# Patient Record
Sex: Female | Born: 1985
Health system: Southern US, Community
[De-identification: ages and names within clinical notes are randomized; demographics above are authoritative.]

## PROBLEM LIST (undated history)

## (undated) DIAGNOSIS — M24673 Ankylosis, unspecified ankle: Secondary | ICD-10-CM

## (undated) HISTORY — DX: Ankylosis, unspecified ankle: M24.673

---

## 2015-11-14 DIAGNOSIS — Z9222 Personal history of monoclonal drug therapy: Secondary | ICD-10-CM | POA: Diagnosis not present

## 2015-11-14 DIAGNOSIS — M4690 Unspecified inflammatory spondylopathy, site unspecified: Secondary | ICD-10-CM | POA: Diagnosis not present

## 2015-11-21 DIAGNOSIS — K080 Exfoliation of teeth due to systemic causes: Secondary | ICD-10-CM | POA: Diagnosis not present

## 2015-12-24 DIAGNOSIS — Z6829 Body mass index (BMI) 29.0-29.9, adult: Secondary | ICD-10-CM | POA: Diagnosis not present

## 2015-12-24 DIAGNOSIS — Z01419 Encounter for gynecological examination (general) (routine) without abnormal findings: Secondary | ICD-10-CM | POA: Diagnosis not present

## 2016-04-28 DIAGNOSIS — L723 Sebaceous cyst: Secondary | ICD-10-CM | POA: Diagnosis not present

## 2016-04-28 DIAGNOSIS — Z3009 Encounter for other general counseling and advice on contraception: Secondary | ICD-10-CM | POA: Diagnosis not present

## 2016-05-07 DIAGNOSIS — Z9222 Personal history of monoclonal drug therapy: Secondary | ICD-10-CM | POA: Diagnosis not present

## 2016-05-07 DIAGNOSIS — M4690 Unspecified inflammatory spondylopathy, site unspecified: Secondary | ICD-10-CM | POA: Diagnosis not present

## 2016-05-18 DIAGNOSIS — Z30430 Encounter for insertion of intrauterine contraceptive device: Secondary | ICD-10-CM | POA: Diagnosis not present

## 2016-06-22 DIAGNOSIS — N644 Mastodynia: Secondary | ICD-10-CM | POA: Diagnosis not present

## 2016-07-07 DIAGNOSIS — Z30431 Encounter for routine checking of intrauterine contraceptive device: Secondary | ICD-10-CM | POA: Diagnosis not present

## 2016-07-07 DIAGNOSIS — N644 Mastodynia: Secondary | ICD-10-CM | POA: Diagnosis not present

## 2016-09-01 DIAGNOSIS — Z9222 Personal history of monoclonal drug therapy: Secondary | ICD-10-CM | POA: Diagnosis not present

## 2016-09-01 DIAGNOSIS — M4690 Unspecified inflammatory spondylopathy, site unspecified: Secondary | ICD-10-CM | POA: Diagnosis not present

## 2016-09-01 DIAGNOSIS — M19031 Primary osteoarthritis, right wrist: Secondary | ICD-10-CM | POA: Diagnosis not present

## 2016-09-01 DIAGNOSIS — M19032 Primary osteoarthritis, left wrist: Secondary | ICD-10-CM | POA: Diagnosis not present

## 2016-09-01 DIAGNOSIS — Z6831 Body mass index (BMI) 31.0-31.9, adult: Secondary | ICD-10-CM | POA: Diagnosis not present

## 2017-01-19 DIAGNOSIS — K080 Exfoliation of teeth due to systemic causes: Secondary | ICD-10-CM | POA: Diagnosis not present

## 2017-02-19 DIAGNOSIS — R5383 Other fatigue: Secondary | ICD-10-CM | POA: Diagnosis not present

## 2017-02-19 DIAGNOSIS — Z0000 Encounter for general adult medical examination without abnormal findings: Secondary | ICD-10-CM | POA: Diagnosis not present

## 2017-02-19 DIAGNOSIS — R51 Headache: Secondary | ICD-10-CM | POA: Diagnosis not present

## 2017-02-19 DIAGNOSIS — E669 Obesity, unspecified: Secondary | ICD-10-CM | POA: Diagnosis not present

## 2017-02-22 ENCOUNTER — Other Ambulatory Visit: Payer: Self-pay | Admitting: Family Medicine

## 2017-02-23 ENCOUNTER — Other Ambulatory Visit: Payer: Self-pay | Admitting: Family Medicine

## 2017-02-23 DIAGNOSIS — G4452 New daily persistent headache (NDPH): Secondary | ICD-10-CM

## 2017-02-28 ENCOUNTER — Ambulatory Visit
Admission: RE | Admit: 2017-02-28 | Discharge: 2017-02-28 | Disposition: A | Discharge status: Home / Self Care | Payer: Self-pay | Source: Ambulatory Visit | Attending: Family Medicine | Admitting: Family Medicine

## 2017-02-28 DIAGNOSIS — R51 Headache: Secondary | ICD-10-CM | POA: Diagnosis not present

## 2017-02-28 DIAGNOSIS — G4452 New daily persistent headache (NDPH): Secondary | ICD-10-CM

## 2017-03-04 DIAGNOSIS — Z9222 Personal history of monoclonal drug therapy: Secondary | ICD-10-CM | POA: Diagnosis not present

## 2017-03-04 DIAGNOSIS — M4690 Unspecified inflammatory spondylopathy, site unspecified: Secondary | ICD-10-CM | POA: Diagnosis not present

## 2017-03-16 DIAGNOSIS — Z6832 Body mass index (BMI) 32.0-32.9, adult: Secondary | ICD-10-CM | POA: Diagnosis not present

## 2017-03-16 DIAGNOSIS — Z01419 Encounter for gynecological examination (general) (routine) without abnormal findings: Secondary | ICD-10-CM | POA: Diagnosis not present

## 2017-04-07 DIAGNOSIS — M5408 Panniculitis affecting regions of neck and back, sacral and sacrococcygeal region: Secondary | ICD-10-CM | POA: Diagnosis not present

## 2017-04-07 DIAGNOSIS — M9902 Segmental and somatic dysfunction of thoracic region: Secondary | ICD-10-CM | POA: Diagnosis not present

## 2017-04-07 DIAGNOSIS — M62838 Other muscle spasm: Secondary | ICD-10-CM | POA: Diagnosis not present

## 2017-04-07 DIAGNOSIS — M9903 Segmental and somatic dysfunction of lumbar region: Secondary | ICD-10-CM | POA: Diagnosis not present

## 2017-04-13 DIAGNOSIS — M9903 Segmental and somatic dysfunction of lumbar region: Secondary | ICD-10-CM | POA: Diagnosis not present

## 2017-04-13 DIAGNOSIS — M9902 Segmental and somatic dysfunction of thoracic region: Secondary | ICD-10-CM | POA: Diagnosis not present

## 2017-04-13 DIAGNOSIS — M62838 Other muscle spasm: Secondary | ICD-10-CM | POA: Diagnosis not present

## 2017-04-13 DIAGNOSIS — M5408 Panniculitis affecting regions of neck and back, sacral and sacrococcygeal region: Secondary | ICD-10-CM | POA: Diagnosis not present

## 2017-05-14 DIAGNOSIS — D508 Other iron deficiency anemias: Secondary | ICD-10-CM | POA: Diagnosis not present

## 2017-05-14 DIAGNOSIS — E559 Vitamin D deficiency, unspecified: Secondary | ICD-10-CM | POA: Diagnosis not present

## 2017-05-14 DIAGNOSIS — R946 Abnormal results of thyroid function studies: Secondary | ICD-10-CM | POA: Diagnosis not present

## 2017-08-04 DIAGNOSIS — K080 Exfoliation of teeth due to systemic causes: Secondary | ICD-10-CM | POA: Diagnosis not present

## 2017-11-03 DIAGNOSIS — G44209 Tension-type headache, unspecified, not intractable: Secondary | ICD-10-CM | POA: Diagnosis not present

## 2017-11-03 DIAGNOSIS — E559 Vitamin D deficiency, unspecified: Secondary | ICD-10-CM | POA: Diagnosis not present

## 2017-11-03 DIAGNOSIS — D508 Other iron deficiency anemias: Secondary | ICD-10-CM | POA: Diagnosis not present

## 2017-11-03 DIAGNOSIS — R946 Abnormal results of thyroid function studies: Secondary | ICD-10-CM | POA: Diagnosis not present

## 2017-11-10 DIAGNOSIS — F411 Generalized anxiety disorder: Secondary | ICD-10-CM | POA: Diagnosis not present

## 2017-12-15 DIAGNOSIS — F411 Generalized anxiety disorder: Secondary | ICD-10-CM | POA: Diagnosis not present

## 2017-12-22 DIAGNOSIS — F411 Generalized anxiety disorder: Secondary | ICD-10-CM | POA: Diagnosis not present

## 2017-12-29 DIAGNOSIS — F411 Generalized anxiety disorder: Secondary | ICD-10-CM | POA: Diagnosis not present

## 2018-01-12 DIAGNOSIS — F411 Generalized anxiety disorder: Secondary | ICD-10-CM | POA: Diagnosis not present

## 2018-01-19 DIAGNOSIS — F411 Generalized anxiety disorder: Secondary | ICD-10-CM | POA: Diagnosis not present

## 2018-01-19 DIAGNOSIS — Z79899 Other long term (current) drug therapy: Secondary | ICD-10-CM | POA: Diagnosis not present

## 2018-01-19 DIAGNOSIS — L409 Psoriasis, unspecified: Secondary | ICD-10-CM | POA: Diagnosis not present

## 2018-01-19 DIAGNOSIS — M459 Ankylosing spondylitis of unspecified sites in spine: Secondary | ICD-10-CM | POA: Diagnosis not present

## 2018-01-26 DIAGNOSIS — F411 Generalized anxiety disorder: Secondary | ICD-10-CM | POA: Diagnosis not present

## 2018-02-14 DIAGNOSIS — F411 Generalized anxiety disorder: Secondary | ICD-10-CM | POA: Diagnosis not present

## 2018-02-21 DIAGNOSIS — F411 Generalized anxiety disorder: Secondary | ICD-10-CM | POA: Diagnosis not present

## 2018-03-14 DIAGNOSIS — F411 Generalized anxiety disorder: Secondary | ICD-10-CM | POA: Diagnosis not present

## 2018-03-21 DIAGNOSIS — F411 Generalized anxiety disorder: Secondary | ICD-10-CM | POA: Diagnosis not present

## 2018-04-04 DIAGNOSIS — F411 Generalized anxiety disorder: Secondary | ICD-10-CM | POA: Diagnosis not present

## 2018-04-11 DIAGNOSIS — F411 Generalized anxiety disorder: Secondary | ICD-10-CM | POA: Diagnosis not present

## 2018-04-18 DIAGNOSIS — F411 Generalized anxiety disorder: Secondary | ICD-10-CM | POA: Diagnosis not present

## 2018-05-10 DIAGNOSIS — F411 Generalized anxiety disorder: Secondary | ICD-10-CM | POA: Diagnosis not present

## 2018-05-23 DIAGNOSIS — Z1151 Encounter for screening for human papillomavirus (HPV): Secondary | ICD-10-CM | POA: Diagnosis not present

## 2018-05-23 DIAGNOSIS — Z6833 Body mass index (BMI) 33.0-33.9, adult: Secondary | ICD-10-CM | POA: Diagnosis not present

## 2018-05-23 DIAGNOSIS — Z01419 Encounter for gynecological examination (general) (routine) without abnormal findings: Secondary | ICD-10-CM | POA: Diagnosis not present

## 2018-06-09 DIAGNOSIS — F411 Generalized anxiety disorder: Secondary | ICD-10-CM | POA: Diagnosis not present

## 2018-06-17 DIAGNOSIS — H9193 Unspecified hearing loss, bilateral: Secondary | ICD-10-CM | POA: Diagnosis not present

## 2018-07-14 DIAGNOSIS — Z20828 Contact with and (suspected) exposure to other viral communicable diseases: Secondary | ICD-10-CM | POA: Diagnosis not present

## 2018-07-21 DIAGNOSIS — Z79899 Other long term (current) drug therapy: Secondary | ICD-10-CM | POA: Diagnosis not present

## 2018-07-21 DIAGNOSIS — L409 Psoriasis, unspecified: Secondary | ICD-10-CM | POA: Diagnosis not present

## 2018-07-21 DIAGNOSIS — M459 Ankylosing spondylitis of unspecified sites in spine: Secondary | ICD-10-CM | POA: Diagnosis not present

## 2018-07-22 DIAGNOSIS — M459 Ankylosing spondylitis of unspecified sites in spine: Secondary | ICD-10-CM | POA: Diagnosis not present

## 2018-08-25 DIAGNOSIS — F411 Generalized anxiety disorder: Secondary | ICD-10-CM | POA: Diagnosis not present

## 2018-09-13 DIAGNOSIS — F411 Generalized anxiety disorder: Secondary | ICD-10-CM | POA: Diagnosis not present

## 2018-09-22 DIAGNOSIS — F411 Generalized anxiety disorder: Secondary | ICD-10-CM | POA: Diagnosis not present

## 2018-10-06 DIAGNOSIS — F411 Generalized anxiety disorder: Secondary | ICD-10-CM | POA: Diagnosis not present

## 2018-10-12 DIAGNOSIS — F411 Generalized anxiety disorder: Secondary | ICD-10-CM | POA: Diagnosis not present

## 2018-10-19 DIAGNOSIS — F411 Generalized anxiety disorder: Secondary | ICD-10-CM | POA: Diagnosis not present

## 2018-11-14 DIAGNOSIS — J4531 Mild persistent asthma with (acute) exacerbation: Secondary | ICD-10-CM | POA: Diagnosis not present

## 2018-11-14 DIAGNOSIS — J209 Acute bronchitis, unspecified: Secondary | ICD-10-CM | POA: Diagnosis not present

## 2018-12-27 DIAGNOSIS — F411 Generalized anxiety disorder: Secondary | ICD-10-CM | POA: Diagnosis not present

## 2019-01-02 DIAGNOSIS — F411 Generalized anxiety disorder: Secondary | ICD-10-CM | POA: Diagnosis not present

## 2019-01-20 DIAGNOSIS — Z79899 Other long term (current) drug therapy: Secondary | ICD-10-CM | POA: Diagnosis not present

## 2019-01-20 DIAGNOSIS — M2550 Pain in unspecified joint: Secondary | ICD-10-CM | POA: Diagnosis not present

## 2019-01-20 DIAGNOSIS — M459 Ankylosing spondylitis of unspecified sites in spine: Secondary | ICD-10-CM | POA: Diagnosis not present

## 2019-01-20 DIAGNOSIS — L409 Psoriasis, unspecified: Secondary | ICD-10-CM | POA: Diagnosis not present

## 2019-01-27 DIAGNOSIS — F411 Generalized anxiety disorder: Secondary | ICD-10-CM | POA: Diagnosis not present

## 2019-04-25 DIAGNOSIS — F411 Generalized anxiety disorder: Secondary | ICD-10-CM | POA: Diagnosis not present

## 2019-05-11 DIAGNOSIS — F411 Generalized anxiety disorder: Secondary | ICD-10-CM | POA: Diagnosis not present

## 2019-05-27 DIAGNOSIS — N390 Urinary tract infection, site not specified: Secondary | ICD-10-CM | POA: Diagnosis not present

## 2019-06-13 DIAGNOSIS — F411 Generalized anxiety disorder: Secondary | ICD-10-CM | POA: Diagnosis not present

## 2019-06-20 DIAGNOSIS — F411 Generalized anxiety disorder: Secondary | ICD-10-CM | POA: Diagnosis not present

## 2019-06-27 DIAGNOSIS — F411 Generalized anxiety disorder: Secondary | ICD-10-CM | POA: Diagnosis not present

## 2019-07-02 IMAGING — MR MR HEAD W/O CM
10 series · 48 of 48 positions shown · non-contrast
Comparison: None.

CLINICAL DATA: 31-year-old female with 2-3 months of daily,
persistent headaches. Pain at the base of the skull and neck.

EXAM:
MRI HEAD WITHOUT CONTRAST
TECHNIQUE: Multiplanar, multiecho pulse sequences of the brain and surrounding
structures were obtained without intravenous contrast.

[Series 2: t1_se_sag · sagittal · 5.0mm · 0.45mm/px · 3 of 24 slices shown]
[im 1/24]
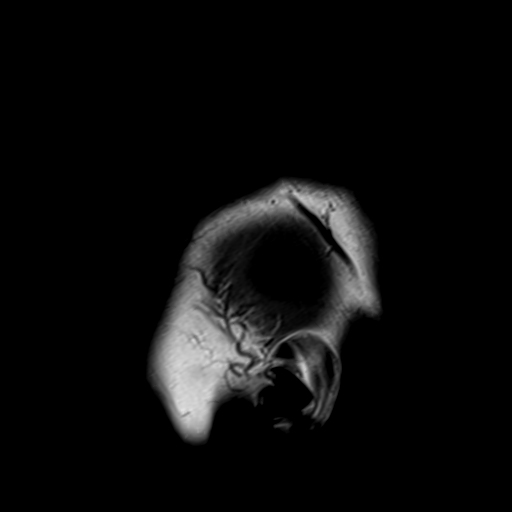
[im 12/24]
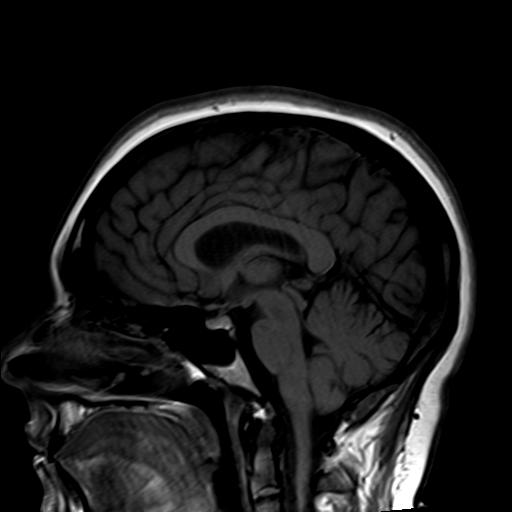
[im 24/24]
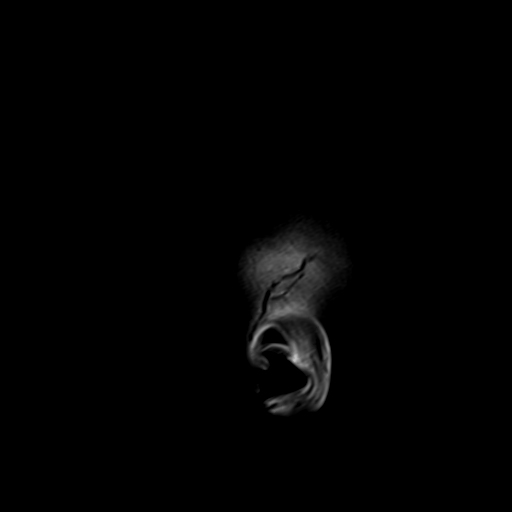

[Series 3: ep2d_diff_(id)_trace · axial · 3.0mm · 1.88mm/px · z∈[-81,+72]mm · 8 of 104 slices shown]
[im 1/104]
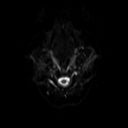
[im 15/104]
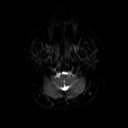
[im 30/104]
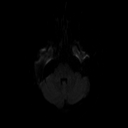
[im 45/104]
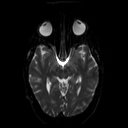
[im 59/104]
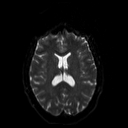
[im 74/104]
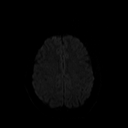
[im 89/104]
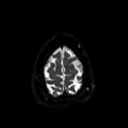
[im 104/104]
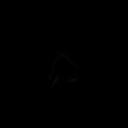

[Series 4: ep2d_diff_(id)_trace_adc · axial · 3.0mm · 1.88mm/px · z∈[-81,+72]mm · 4 of 52 slices shown]
[im 1/52]
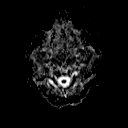
[im 18/52]
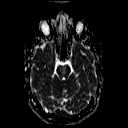
[im 35/52]
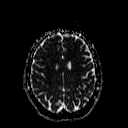
[im 52/52]
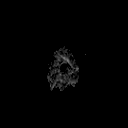

[Series 5: ep2d_diff_cor · coronal · 5.0mm · 1.77mm/px · 5 of 62 slices shown]
[im 1/62]
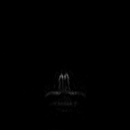
[im 16/62]
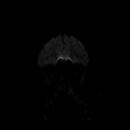
[im 31/62]
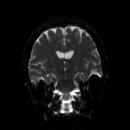
[im 46/62]
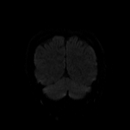
[im 62/62]
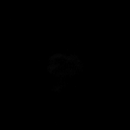

[Series 6: ep2d_diff_cor_adc · coronal · 5.0mm · 1.77mm/px · 2 of 31 slices shown]
[im 1/31]
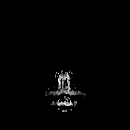
[im 31/31]
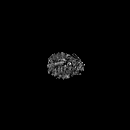

[Series 8: swi_images · axial · 2.0mm · 0.94mm/px · z∈[-88,+70]mm · 6 of 80 slices shown]
[im 1/80]
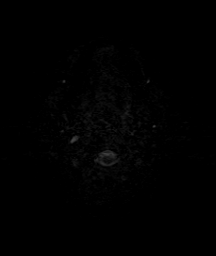
[im 16/80]
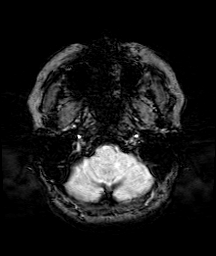
[im 32/80]
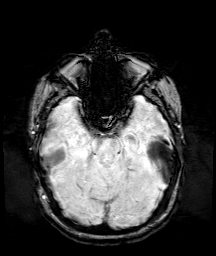
[im 48/80]
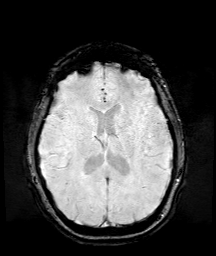
[im 64/80]
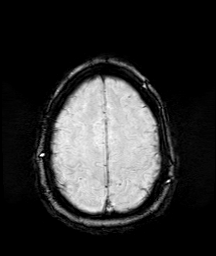
[im 80/80]
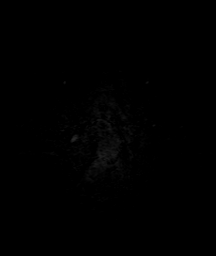

[Series 9: FLAIR · axial · 3.0mm · 0.47mm/px · z∈[-87,+68]mm · 2 of 27 slices shown]
[im 1/27]
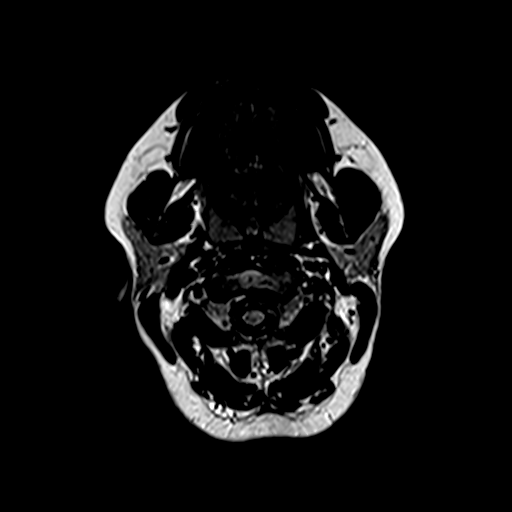
[im 27/27]
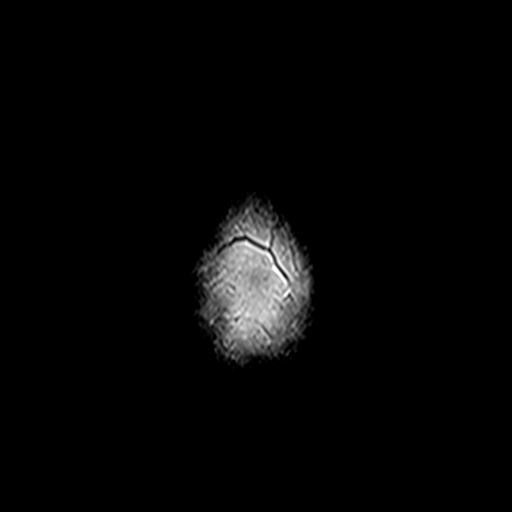

[Series 10: t2_tse_tra_512 · axial · 5.0mm · 0.62mm/px · z∈[-86,+69]mm · 2 of 27 slices shown]
[im 1/27]
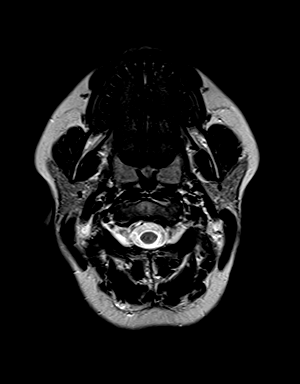
[im 27/27]
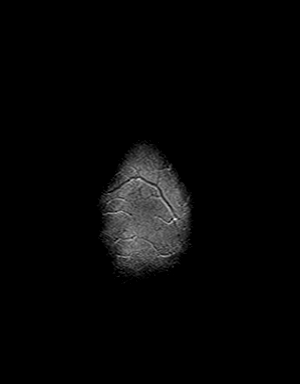

[Series 11: t1_mpr_tra · axial · 1.0mm · 0.75mm/px · z∈[-88,+71]mm · 13 of 160 slices shown]
[im 1/160]
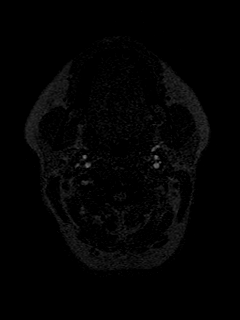
[im 14/160]
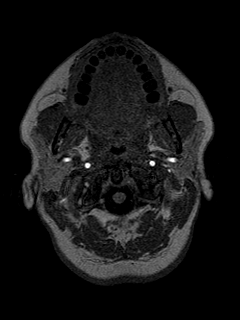
[im 27/160]
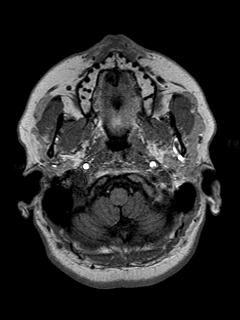
[im 40/160]
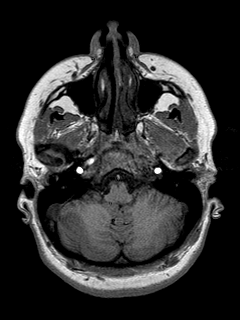
[im 54/160]
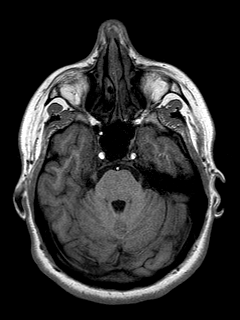
[im 67/160]
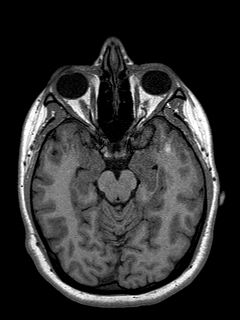
[im 80/160]
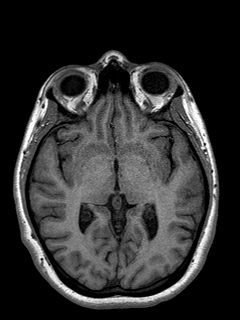
[im 93/160]
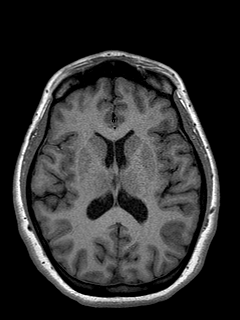
[im 107/160]
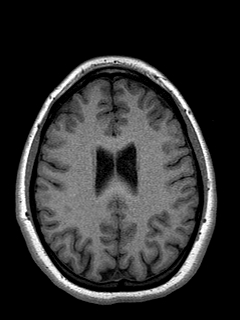
[im 120/160]
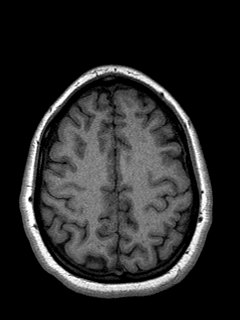
[im 133/160]
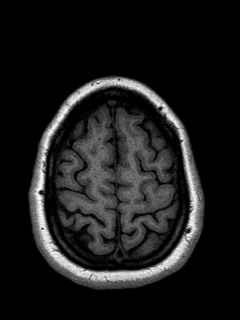
[im 146/160]
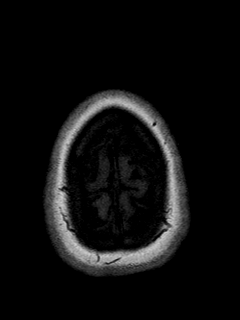
[im 160/160]
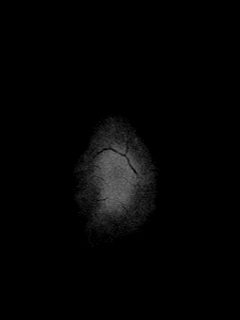

[Series 12: T2 · coronal · 5.0mm · 0.45mm/px · 3 of 32 slices shown]
[im 1/32]
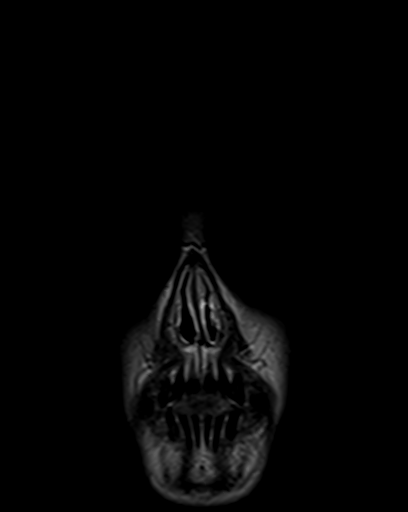
[im 16/32]
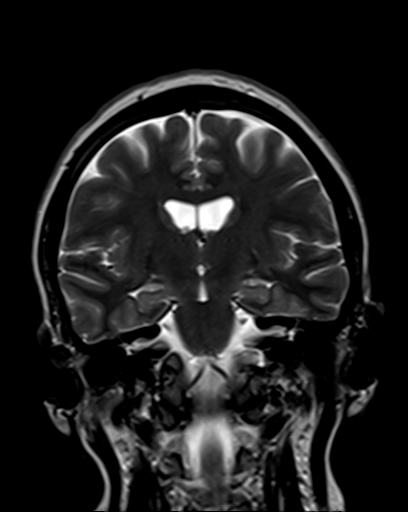
[im 32/32]
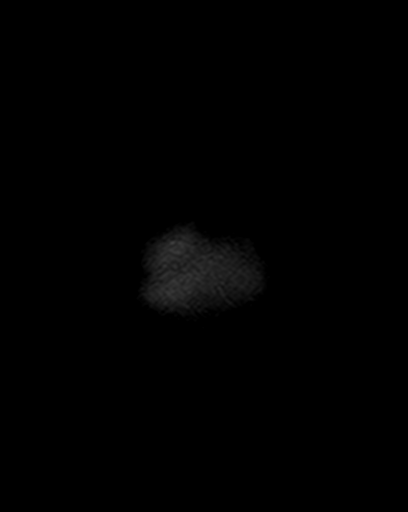

[48 of 48 positions shown; findings below may reference images not displayed]

FINDINGS: Brain: Cerebral volume is within normal limits. No restricted
diffusion to suggest acute infarction. No midline shift, mass
effect, evidence of mass lesion, ventriculomegaly, extra-axial
collection or acute intracranial hemorrhage. Cervicomedullary
junction and pituitary are within normal limits. Gray and white
matter signal is within normal limits throughout the brain. No
encephalomalacia or chronic cerebral blood products.

Vascular: Major intracranial vascular flow voids are preserved.

Skull and upper cervical spine: Normal visible cervical spine.
Visualized bone marrow signal is within normal limits.

Sinuses/Orbits: Normal orbits soft tissues. Paranasal sinuses are
clear.

Other: Visible internal auditory structures appear normal. Mastoid
air cells are clear. Scalp and face soft tissues appear normal.
IMPRESSION: Normal noncontrast brain MRI

## 2019-07-04 DIAGNOSIS — F411 Generalized anxiety disorder: Secondary | ICD-10-CM | POA: Diagnosis not present

## 2019-07-14 DIAGNOSIS — L409 Psoriasis, unspecified: Secondary | ICD-10-CM | POA: Diagnosis not present

## 2019-07-14 DIAGNOSIS — M2550 Pain in unspecified joint: Secondary | ICD-10-CM | POA: Diagnosis not present

## 2019-07-14 DIAGNOSIS — M459 Ankylosing spondylitis of unspecified sites in spine: Secondary | ICD-10-CM | POA: Diagnosis not present

## 2019-07-14 DIAGNOSIS — Z79899 Other long term (current) drug therapy: Secondary | ICD-10-CM | POA: Diagnosis not present

## 2019-08-02 DIAGNOSIS — F411 Generalized anxiety disorder: Secondary | ICD-10-CM | POA: Diagnosis not present

## 2019-08-29 DIAGNOSIS — F411 Generalized anxiety disorder: Secondary | ICD-10-CM | POA: Diagnosis not present

## 2019-10-09 DIAGNOSIS — F411 Generalized anxiety disorder: Secondary | ICD-10-CM | POA: Diagnosis not present

## 2019-10-09 DIAGNOSIS — M459 Ankylosing spondylitis of unspecified sites in spine: Secondary | ICD-10-CM | POA: Insufficient documentation

## 2019-10-10 DIAGNOSIS — Z30432 Encounter for removal of intrauterine contraceptive device: Secondary | ICD-10-CM | POA: Diagnosis not present

## 2019-10-10 DIAGNOSIS — Z1389 Encounter for screening for other disorder: Secondary | ICD-10-CM | POA: Diagnosis not present

## 2019-10-10 DIAGNOSIS — Z01419 Encounter for gynecological examination (general) (routine) without abnormal findings: Secondary | ICD-10-CM | POA: Diagnosis not present

## 2019-10-10 DIAGNOSIS — Z3009 Encounter for other general counseling and advice on contraception: Secondary | ICD-10-CM | POA: Diagnosis not present

## 2019-10-10 DIAGNOSIS — Z6833 Body mass index (BMI) 33.0-33.9, adult: Secondary | ICD-10-CM | POA: Diagnosis not present

## 2019-10-23 DIAGNOSIS — F411 Generalized anxiety disorder: Secondary | ICD-10-CM | POA: Diagnosis not present

## 2019-10-30 DIAGNOSIS — F411 Generalized anxiety disorder: Secondary | ICD-10-CM | POA: Diagnosis not present

## 2019-11-06 DIAGNOSIS — F411 Generalized anxiety disorder: Secondary | ICD-10-CM | POA: Diagnosis not present

## 2019-11-16 DIAGNOSIS — J069 Acute upper respiratory infection, unspecified: Secondary | ICD-10-CM | POA: Diagnosis not present

## 2019-11-20 DIAGNOSIS — F411 Generalized anxiety disorder: Secondary | ICD-10-CM | POA: Diagnosis not present

## 2019-11-23 ENCOUNTER — Ambulatory Visit (INDEPENDENT_AMBULATORY_CARE_PROVIDER_SITE_OTHER): Payer: Federal, State, Local not specified - PPO | Admitting: Family Medicine

## 2019-11-23 ENCOUNTER — Encounter: Payer: Self-pay | Admitting: Family Medicine

## 2019-11-23 ENCOUNTER — Other Ambulatory Visit: Payer: Self-pay

## 2019-11-23 VITALS — BP 121/78 | HR 93 | Temp 98.3°F | Ht 64.5 in | Wt 203.4 lb

## 2019-11-23 DIAGNOSIS — E559 Vitamin D deficiency, unspecified: Secondary | ICD-10-CM

## 2019-11-23 DIAGNOSIS — U071 COVID-19: Secondary | ICD-10-CM | POA: Diagnosis not present

## 2019-11-23 DIAGNOSIS — Z0000 Encounter for general adult medical examination without abnormal findings: Secondary | ICD-10-CM | POA: Diagnosis not present

## 2019-11-23 MED ORDER — LEVONORGESTREL-ETHINYL ESTRAD 0.1-20 MG-MCG PO TABS: 1.0000 | ORAL_TABLET | Freq: Every day | ORAL | 1 refills | Status: DC

## 2019-11-23 NOTE — Progress Notes (Signed)
Patient: Cynthia Petersen MRN: [id number] DOB: [birth year] PCP: Orland Mustard, MD     Subjective:  Chief Complaint  Patient presents with   Establish Care   Annual Exam    HPI: The patient is a 34 y.o. female who presents today for annual exam. She denies any changes to past medical history. There have been no recent hospitalizations. They are not following a well balanced diet and exercise plan. She enjoys weight lifting 3-4 times daily when she is in routine. Weight has been stable. No complaints today. Tdap done 2017.  No family history of colon cancer or breast cancer.   She does have history of AS and is followed by rheumatology and is on enbrel.   She has had covid and did fine recently. +natural immunity.  Would like Ab test.   Immunization History  Administered Date(s) Administered   Tdap 10/11/2014    Colonoscopy: routine screening  Mammogram: routine screening  Pap smear: 05/23/2018   Review of Systems  Constitutional: Negative for chills, fatigue and fever.  HENT: Negative for dental problem, ear pain, hearing loss, sinus pressure, sinus pain and trouble swallowing.   Eyes: Negative for visual disturbance.  Respiratory: Negative for cough, chest tightness, shortness of breath and wheezing.   Cardiovascular: Negative for chest pain, palpitations and leg swelling.  Gastrointestinal: Negative for abdominal pain, blood in stool, diarrhea and nausea.  Endocrine: Negative for cold intolerance, polydipsia, polyphagia and polyuria.  Genitourinary: Negative for dysuria, frequency, hematuria and pelvic pain.  Musculoskeletal: Negative for arthralgias and back pain.  Skin: Negative for rash.  Neurological: Negative for dizziness and headaches.  Psychiatric/Behavioral: Negative for dysphoric mood and sleep disturbance. The patient is not nervous/anxious.     Allergies Patient has No Known Allergies.  Past Medical History Patient  has a past medical history of  Ankylosis of ankle.  Surgical History Patient  has a past surgical history that includes Cesarean section (2014) and Cesarean section (2017).  Family History Pateint's family history includes Asthma in her paternal grandmother; Depression in her father; Diabetes in her maternal grandfather and paternal grandmother; Early death in her paternal grandmother; Hearing loss in her father; Lupus in her mother.  Social History Patient  reports that she has never smoked. She has never used smokeless tobacco. She reports that she does not drink alcohol and does not use drugs.    Objective: Vitals:   11/23/19 1119  BP: 121/78  Pulse: 93  Temp: 98.3 F (36.8 C)  TempSrc: Temporal  SpO2: 97%  Weight: 203 lb 6.4 oz (92.3 kg)  Height: 5' 4.5" (1.638 m)    Body mass index is 34.37 kg/m.  Physical Exam Vitals reviewed.  Constitutional:      Appearance: Normal appearance. She is well-developed. She is obese.  HENT:     Head: Normocephalic and atraumatic.     Right Ear: Tympanic membrane, ear canal and external ear normal.     Left Ear: Tympanic membrane, ear canal and external ear normal.     Mouth/Throat:     Mouth: Mucous membranes are moist.  Eyes:     Extraocular Movements: Extraocular movements intact.     Conjunctiva/sclera: Conjunctivae normal.     Pupils: Pupils are equal, round, and reactive to light.  Neck:     Thyroid: No thyromegaly.  Cardiovascular:     Rate and Rhythm: Normal rate and regular rhythm.     Pulses: Normal pulses.     Heart sounds: Normal heart sounds. No murmur  heard.   Pulmonary:     Effort: Pulmonary effort is normal.     Breath sounds: Normal breath sounds.  Abdominal:     General: Bowel sounds are normal. There is no distension.     Palpations: Abdomen is soft.     Tenderness: There is no abdominal tenderness.  Musculoskeletal:     Cervical back: Normal range of motion and neck supple.  Lymphadenopathy:     Cervical: No cervical adenopathy.   Skin:    General: Skin is warm and dry.     Capillary Refill: Capillary refill takes less than 2 seconds.     Findings: No rash.  Neurological:     General: No focal deficit present.     Mental Status: She is alert and oriented to person, place, and time.     Cranial Nerves: No cranial nerve deficit.     Coordination: Coordination normal.     Deep Tendon Reflexes: Reflexes normal.  Psychiatric:        Mood and Affect: Mood normal.        Behavior: Behavior normal.      Office Visit from 11/23/2019 in [facility]  PHQ-2 Total Score 0         Assessment/plan: 1. Annual physical exam Routine lab work today. Hm reviewed. Encouraged exercise and weight loss. Refilled ocp. UTD on pap smear. F/u in one year or as needed.  Patient counseling [x]    Nutrition: Stressed importance of moderation in sodium/caffeine intake, saturated fat and cholesterol, caloric balance, sufficient intake of fresh fruits, vegetables, fiber, calcium, iron, and 1 mg of folate supplement per day (for females capable of pregnancy).  [x]    Stressed the importance of regular exercise.   []    Substance Abuse: Discussed cessation/primary prevention of tobacco, alcohol, or other drug use; driving or other dangerous activities under the influence; availability of treatment for abuse.   [x]    Injury prevention: Discussed safety belts, safety helmets, smoke detector, smoking near bedding or upholstery.   [x]    Sexuality: Discussed sexually transmitted diseases, partner selection, use of condoms, avoidance of unintended pregnancy  and contraceptive alternatives.  [x]    Dental health: Discussed importance of regular tooth brushing, flossing, and dental visits.  [x]    Health maintenance and immunizations reviewed. Please refer to Health maintenance section.    - CBC with Differential/Platelet; Future - Lipid panel; Future - TSH; Future - COMPLETE METABOLIC PANEL WITH GFR; Future - COMPLETE  METABOLIC PANEL WITH GFR - TSH - Lipid panel - CBC with Differential/Platelet  2. Vitamin D deficiency Would like px vitamin D if needed.  - VITAMIN D 25 Hydroxy (Vit-D Deficiency, Fractures); Future - VITAMIN D 25 Hydroxy (Vit-D Deficiency, Fractures)  3. COVID-19  - SARS CoV2 Serology(COVID19) AB(IgG,IgM),Immunoassay; Future - SARS CoV2 Serology(COVID19) AB(IgG,IgM),Immunoassay  This visit occurred during the SARS-CoV-2 public health emergency.  Safety protocols were in place, including screening questions prior to the visit, additional usage of staff PPE, and extensive cleaning of exam room while observing appropriate contact time as indicated for disinfecting solutions.     Return in about 1 year (around 11/22/2020) for annual .     , MD [place] [facility]  11/23/2019

## 2019-11-23 NOTE — Patient Instructions (Addendum)
So nice to meet you! Let me know if you need anything.    Preventive Care 77-32 Years Old, Female Preventive care refers to visits with your health care provider and lifestyle choices that can promote health and wellness. This includes:  A yearly physical exam. This may also be called an annual well check.  Regular dental visits and eye exams.  Immunizations.  Screening for certain conditions.  Healthy lifestyle choices, such as eating a healthy diet, getting regular exercise, not using drugs or products that contain nicotine and tobacco, and limiting alcohol use. What can I expect for my preventive care visit? Physical exam Your health care provider will check your:  Height and weight. This may be used to calculate body mass index (BMI), which tells if you are at a healthy weight.  Heart rate and blood pressure.  Skin for abnormal spots. Counseling Your health care provider may ask you questions about your:  Alcohol, tobacco, and drug use.  Emotional well-being.  Home and relationship well-being.  Sexual activity.  Eating habits.  Work and work Statistician.  Method of birth control.  Menstrual cycle.  Pregnancy history. What immunizations do I need?  Influenza (flu) vaccine  This is recommended every year. Tetanus, diphtheria, and pertussis (Tdap) vaccine  You may need a Td booster every 10 years. Varicella (chickenpox) vaccine  You may need this if you have not been vaccinated. Human papillomavirus (HPV) vaccine  If recommended by your health care provider, you may need three doses over 6 months. Measles, mumps, and rubella (MMR) vaccine  You may need at least one dose of MMR. You may also need a second dose. Meningococcal conjugate (MenACWY) vaccine  One dose is recommended if you are age 56-21 years and a first-year college student living in a residence hall, or if you have one of several medical conditions. You may also need additional booster  doses. Pneumococcal conjugate (PCV13) vaccine  You may need this if you have certain conditions and were not previously vaccinated. Pneumococcal polysaccharide (PPSV23) vaccine  You may need one or two doses if you smoke cigarettes or if you have certain conditions. Hepatitis A vaccine  You may need this if you have certain conditions or if you travel or work in places where you may be exposed to hepatitis A. Hepatitis B vaccine  You may need this if you have certain conditions or if you travel or work in places where you may be exposed to hepatitis B. Haemophilus influenzae type b (Hib) vaccine  You may need this if you have certain conditions. You may receive vaccines as individual doses or as more than one vaccine together in one shot (combination vaccines). Talk with your health care provider about the risks and benefits of combination vaccines. What tests do I need?  Blood tests  Lipid and cholesterol levels. These may be checked every 5 years starting at age 83.  Hepatitis C test.  Hepatitis B test. Screening  Diabetes screening. This is done by checking your blood sugar (glucose) after you have not eaten for a while (fasting).  Sexually transmitted disease (STD) testing.  BRCA-related cancer screening. This may be done if you have a family history of breast, ovarian, tubal, or peritoneal cancers.  Pelvic exam and Pap test. This may be done every 3 years starting at age 25. Starting at age 20, this may be done every 5 years if you have a Pap test in combination with an HPV test. Talk with your health care  provider about your test results, treatment options, and if necessary, the need for more tests. Follow these instructions at home: Eating and drinking   Eat a diet that includes fresh fruits and vegetables, whole grains, lean protein, and low-fat dairy.  Take vitamin and mineral supplements as recommended by your health care provider.  Do not drink alcohol  if: ? Your health care provider tells you not to drink. ? You are pregnant, may be pregnant, or are planning to become pregnant.  If you drink alcohol: ? Limit how much you have to 0-1 drink a day. ? Be aware of how much alcohol is in your drink. In the [place] one drink equals one 12 oz bottle of beer (355 mL), one 5 oz glass of wine (148 mL), or one 1 oz glass of hard liquor (44 mL). Lifestyle  Take daily care of your teeth and gums.  Stay active. Exercise for at least 30 minutes on 5 or more days each week.  Do not use any products that contain nicotine or tobacco, such as cigarettes, e-cigarettes, and chewing tobacco. If you need help quitting, ask your health care provider.  If you are sexually active, practice safe sex. Use a condom or other form of birth control (contraception) in order to prevent pregnancy and STIs (sexually transmitted infections). If you plan to become pregnant, see your health care provider for a preconception visit. What's next?  Visit your health care provider once a year for a well check visit.  Ask your health care provider how often you should have your eyes and teeth checked.  Stay up to date on all vaccines. This information is not intended to replace advice given to you by your health care provider. Make sure you discuss any questions you have with your health care provider. Document Revised: 10/07/2017 Document Reviewed: 10/07/2017 Elsevier Patient Education  2020 [employer].

## 2019-11-24 LAB — LIPID PANEL
Cholesterol: 193 mg/dL (ref <200)
HDL: 58 mg/dL (ref >=50)
LDL Cholesterol (Calc): 112 mg/dL (calc) — ABNORMAL HIGH
Non-HDL Cholesterol (Calc): 135 mg/dL (calc) — ABNORMAL HIGH (ref <130)
Total CHOL/HDL Ratio: 3.3 (calc) (ref <5.0)
Triglycerides: 124 mg/dL (ref <150)

## 2019-11-24 LAB — CBC WITH DIFFERENTIAL/PLATELET
Absolute Monocytes: 391 cells/uL (ref 200–950)
Basophils Absolute: 28 cells/uL (ref 0–200)
Basophils Relative: 0.4 %
Eosinophils Absolute: 99 cells/uL (ref 15–500)
Eosinophils Relative: 1.4 %
HCT: 40.4 % (ref 35.0–45.0)
Hemoglobin: 13.1 g/dL (ref 11.7–15.5)
Lymphs Abs: 1832 cells/uL (ref 850–3900)
MCH: 27.5 pg (ref 27.0–33.0)
MCHC: 32.4 g/dL (ref 32.0–36.0)
MCV: 84.9 fL (ref 80.0–100.0)
MPV: 10 fL (ref 7.5–12.5)
Monocytes Relative: 5.5 %
Neutro Abs: 4750 cells/uL (ref 1500–7800)
Neutrophils Relative %: 66.9 %
Platelets: 362 10*3/uL (ref 140–400)
RBC: 4.76 10*6/uL (ref 3.80–5.10)
RDW: 13 % (ref 11.0–15.0)
Total Lymphocyte: 25.8 %
WBC: 7.1 10*3/uL (ref 3.8–10.8)

## 2019-11-24 LAB — COMPLETE METABOLIC PANEL WITH GFR
AG Ratio: 1.2 (calc) (ref 1.0–2.5)
ALT: 10 U/L (ref 6–29)
AST: 14 U/L (ref 10–30)
Albumin: 4.2 g/dL (ref 3.6–5.1)
Alkaline phosphatase (APISO): 39 U/L (ref 31–125)
BUN: 13 mg/dL (ref 7–25)
CO2: 27 mmol/L (ref 20–32)
Calcium: 9.3 mg/dL (ref 8.6–10.2)
Chloride: 105 mmol/L (ref 98–110)
Creat: 0.67 mg/dL (ref 0.50–1.10)
GFR, Est African American: 136 mL/min/{1.73_m2} (ref >=60)
GFR, Est Non African American: 117 mL/min/{1.73_m2} (ref >=60)
Globulin: 3.5 g/dL (calc) (ref 1.9–3.7)
Glucose, Bld: 75 mg/dL (ref 65–99)
Potassium: 4.6 mmol/L (ref 3.5–5.3)
Sodium: 139 mmol/L (ref 135–146)
Total Bilirubin: 0.3 mg/dL (ref 0.2–1.2)
Total Protein: 7.7 g/dL (ref 6.1–8.1)

## 2019-11-24 LAB — SARS COV-2 SEROLOGY(COVID-19)AB(IGG,IGM),IMMUNOASSAY
SARS CoV-2 AB IgG: POSITIVE — AB
SARS CoV-2 IgM: POSITIVE — AB

## 2019-11-24 LAB — TSH: TSH: 1.27 mIU/L

## 2019-11-24 LAB — VITAMIN D 25 HYDROXY (VIT D DEFICIENCY, FRACTURES): Vit D, 25-Hydroxy: 25 ng/mL — ABNORMAL LOW (ref 30–100)

## 2019-11-27 ENCOUNTER — Other Ambulatory Visit: Payer: Self-pay | Admitting: Family Medicine

## 2019-11-27 DIAGNOSIS — E559 Vitamin D deficiency, unspecified: Secondary | ICD-10-CM | POA: Insufficient documentation

## 2019-11-27 DIAGNOSIS — F411 Generalized anxiety disorder: Secondary | ICD-10-CM | POA: Diagnosis not present

## 2019-11-27 MED ORDER — VITAMIN D (ERGOCALCIFEROL) 1.25 MG (50000 UNIT) PO CAPS: ORAL_CAPSULE | ORAL | 0 refills | Status: DC

## 2019-12-04 DIAGNOSIS — F411 Generalized anxiety disorder: Secondary | ICD-10-CM | POA: Diagnosis not present

## 2019-12-11 DIAGNOSIS — F411 Generalized anxiety disorder: Secondary | ICD-10-CM | POA: Diagnosis not present

## 2019-12-18 DIAGNOSIS — F411 Generalized anxiety disorder: Secondary | ICD-10-CM | POA: Diagnosis not present

## 2019-12-25 DIAGNOSIS — F411 Generalized anxiety disorder: Secondary | ICD-10-CM | POA: Diagnosis not present

## 2020-01-17 ENCOUNTER — Other Ambulatory Visit: Payer: Self-pay | Admitting: Family Medicine

## 2020-01-22 DIAGNOSIS — F411 Generalized anxiety disorder: Secondary | ICD-10-CM | POA: Diagnosis not present

## 2020-01-29 DIAGNOSIS — F411 Generalized anxiety disorder: Secondary | ICD-10-CM | POA: Diagnosis not present

## 2020-03-04 DIAGNOSIS — F411 Generalized anxiety disorder: Secondary | ICD-10-CM | POA: Diagnosis not present

## 2020-03-11 DIAGNOSIS — M459 Ankylosing spondylitis of unspecified sites in spine: Secondary | ICD-10-CM | POA: Diagnosis not present

## 2020-03-11 DIAGNOSIS — M2550 Pain in unspecified joint: Secondary | ICD-10-CM | POA: Diagnosis not present

## 2020-03-11 DIAGNOSIS — L409 Psoriasis, unspecified: Secondary | ICD-10-CM | POA: Diagnosis not present

## 2020-03-11 DIAGNOSIS — Z79899 Other long term (current) drug therapy: Secondary | ICD-10-CM | POA: Diagnosis not present

## 2020-03-18 DIAGNOSIS — F411 Generalized anxiety disorder: Secondary | ICD-10-CM | POA: Diagnosis not present

## 2020-03-25 DIAGNOSIS — F411 Generalized anxiety disorder: Secondary | ICD-10-CM | POA: Diagnosis not present

## 2020-08-31 ENCOUNTER — Other Ambulatory Visit: Payer: Self-pay | Admitting: Family Medicine

## 2020-09-02 ENCOUNTER — Other Ambulatory Visit: Payer: Self-pay

## 2020-09-09 DIAGNOSIS — L409 Psoriasis, unspecified: Secondary | ICD-10-CM | POA: Diagnosis not present

## 2020-09-09 DIAGNOSIS — Z79899 Other long term (current) drug therapy: Secondary | ICD-10-CM | POA: Diagnosis not present

## 2020-09-09 DIAGNOSIS — M2550 Pain in unspecified joint: Secondary | ICD-10-CM | POA: Diagnosis not present

## 2020-09-09 DIAGNOSIS — M459 Ankylosing spondylitis of unspecified sites in spine: Secondary | ICD-10-CM | POA: Diagnosis not present

## 2020-09-09 DIAGNOSIS — R5383 Other fatigue: Secondary | ICD-10-CM | POA: Diagnosis not present

## 2020-09-27 DIAGNOSIS — F411 Generalized anxiety disorder: Secondary | ICD-10-CM | POA: Diagnosis not present

## 2020-10-11 DIAGNOSIS — Z01419 Encounter for gynecological examination (general) (routine) without abnormal findings: Secondary | ICD-10-CM | POA: Diagnosis not present

## 2020-10-11 DIAGNOSIS — Z6836 Body mass index (BMI) 36.0-36.9, adult: Secondary | ICD-10-CM | POA: Diagnosis not present

## 2020-10-11 DIAGNOSIS — Z1389 Encounter for screening for other disorder: Secondary | ICD-10-CM | POA: Diagnosis not present

## 2020-10-11 DIAGNOSIS — Z130 Encounter for screening for diseases of the blood and blood-forming organs and certain disorders involving the immune mechanism: Secondary | ICD-10-CM | POA: Diagnosis not present

## 2020-10-22 DIAGNOSIS — F411 Generalized anxiety disorder: Secondary | ICD-10-CM | POA: Diagnosis not present

## 2020-11-05 DIAGNOSIS — F411 Generalized anxiety disorder: Secondary | ICD-10-CM | POA: Diagnosis not present

## 2020-11-19 DIAGNOSIS — F411 Generalized anxiety disorder: Secondary | ICD-10-CM | POA: Diagnosis not present

## 2020-11-25 ENCOUNTER — Encounter: Payer: Federal, State, Local not specified - PPO | Admitting: Family Medicine

## 2020-12-03 DIAGNOSIS — F411 Generalized anxiety disorder: Secondary | ICD-10-CM | POA: Diagnosis not present

## 2020-12-17 DIAGNOSIS — F411 Generalized anxiety disorder: Secondary | ICD-10-CM | POA: Diagnosis not present

## 2020-12-31 DIAGNOSIS — F411 Generalized anxiety disorder: Secondary | ICD-10-CM | POA: Diagnosis not present

## 2021-01-14 DIAGNOSIS — F411 Generalized anxiety disorder: Secondary | ICD-10-CM | POA: Diagnosis not present

## 2021-01-28 DIAGNOSIS — F411 Generalized anxiety disorder: Secondary | ICD-10-CM | POA: Diagnosis not present

## 2021-02-11 DIAGNOSIS — F411 Generalized anxiety disorder: Secondary | ICD-10-CM | POA: Diagnosis not present

## 2021-02-25 DIAGNOSIS — F411 Generalized anxiety disorder: Secondary | ICD-10-CM | POA: Diagnosis not present

## 2021-03-04 DIAGNOSIS — F411 Generalized anxiety disorder: Secondary | ICD-10-CM | POA: Diagnosis not present

## 2021-03-12 DIAGNOSIS — R5383 Other fatigue: Secondary | ICD-10-CM | POA: Diagnosis not present

## 2021-03-12 DIAGNOSIS — M459 Ankylosing spondylitis of unspecified sites in spine: Secondary | ICD-10-CM | POA: Diagnosis not present

## 2021-03-12 DIAGNOSIS — L409 Psoriasis, unspecified: Secondary | ICD-10-CM | POA: Diagnosis not present

## 2021-03-12 DIAGNOSIS — Z79899 Other long term (current) drug therapy: Secondary | ICD-10-CM | POA: Diagnosis not present

## 2021-03-12 DIAGNOSIS — M2550 Pain in unspecified joint: Secondary | ICD-10-CM | POA: Diagnosis not present

## 2021-03-25 DIAGNOSIS — F411 Generalized anxiety disorder: Secondary | ICD-10-CM | POA: Diagnosis not present

## 2021-04-08 DIAGNOSIS — F411 Generalized anxiety disorder: Secondary | ICD-10-CM | POA: Diagnosis not present

## 2021-04-21 DIAGNOSIS — F411 Generalized anxiety disorder: Secondary | ICD-10-CM | POA: Diagnosis not present

## 2021-05-08 DIAGNOSIS — F411 Generalized anxiety disorder: Secondary | ICD-10-CM | POA: Diagnosis not present

## 2021-05-20 DIAGNOSIS — F411 Generalized anxiety disorder: Secondary | ICD-10-CM | POA: Diagnosis not present

## 2021-05-21 DIAGNOSIS — J020 Streptococcal pharyngitis: Secondary | ICD-10-CM | POA: Diagnosis not present

## 2021-06-05 DIAGNOSIS — F411 Generalized anxiety disorder: Secondary | ICD-10-CM | POA: Diagnosis not present

## 2021-06-17 DIAGNOSIS — F411 Generalized anxiety disorder: Secondary | ICD-10-CM | POA: Diagnosis not present

## 2021-07-01 DIAGNOSIS — F411 Generalized anxiety disorder: Secondary | ICD-10-CM | POA: Diagnosis not present

## 2021-07-16 DIAGNOSIS — F411 Generalized anxiety disorder: Secondary | ICD-10-CM | POA: Diagnosis not present

## 2021-07-31 DIAGNOSIS — F411 Generalized anxiety disorder: Secondary | ICD-10-CM | POA: Diagnosis not present

## 2021-08-21 DIAGNOSIS — F411 Generalized anxiety disorder: Secondary | ICD-10-CM | POA: Diagnosis not present

## 2021-09-04 DIAGNOSIS — F411 Generalized anxiety disorder: Secondary | ICD-10-CM | POA: Diagnosis not present

## 2021-09-09 DIAGNOSIS — M459 Ankylosing spondylitis of unspecified sites in spine: Secondary | ICD-10-CM | POA: Diagnosis not present

## 2021-09-09 DIAGNOSIS — L409 Psoriasis, unspecified: Secondary | ICD-10-CM | POA: Diagnosis not present

## 2021-09-09 DIAGNOSIS — R5383 Other fatigue: Secondary | ICD-10-CM | POA: Diagnosis not present

## 2021-09-09 DIAGNOSIS — Z79899 Other long term (current) drug therapy: Secondary | ICD-10-CM | POA: Diagnosis not present

## 2021-10-07 DIAGNOSIS — F411 Generalized anxiety disorder: Secondary | ICD-10-CM | POA: Diagnosis not present

## 2021-10-16 DIAGNOSIS — Z130 Encounter for screening for diseases of the blood and blood-forming organs and certain disorders involving the immune mechanism: Secondary | ICD-10-CM | POA: Diagnosis not present

## 2021-10-16 DIAGNOSIS — Z1389 Encounter for screening for other disorder: Secondary | ICD-10-CM | POA: Diagnosis not present

## 2021-10-16 DIAGNOSIS — Z01419 Encounter for gynecological examination (general) (routine) without abnormal findings: Secondary | ICD-10-CM | POA: Diagnosis not present

## 2021-10-16 DIAGNOSIS — M459 Ankylosing spondylitis of unspecified sites in spine: Secondary | ICD-10-CM | POA: Diagnosis not present

## 2021-10-21 DIAGNOSIS — F411 Generalized anxiety disorder: Secondary | ICD-10-CM | POA: Diagnosis not present

## 2021-11-04 DIAGNOSIS — F411 Generalized anxiety disorder: Secondary | ICD-10-CM | POA: Diagnosis not present

## 2021-11-25 DIAGNOSIS — F411 Generalized anxiety disorder: Secondary | ICD-10-CM | POA: Diagnosis not present

## 2021-11-26 DIAGNOSIS — J302 Other seasonal allergic rhinitis: Secondary | ICD-10-CM | POA: Diagnosis not present

## 2021-11-26 DIAGNOSIS — R051 Acute cough: Secondary | ICD-10-CM | POA: Diagnosis not present

## 2021-12-09 DIAGNOSIS — F411 Generalized anxiety disorder: Secondary | ICD-10-CM | POA: Diagnosis not present

## 2021-12-23 DIAGNOSIS — F411 Generalized anxiety disorder: Secondary | ICD-10-CM | POA: Diagnosis not present

## 2022-01-13 DIAGNOSIS — F411 Generalized anxiety disorder: Secondary | ICD-10-CM | POA: Diagnosis not present

## 2022-01-27 DIAGNOSIS — F411 Generalized anxiety disorder: Secondary | ICD-10-CM | POA: Diagnosis not present

## 2022-02-19 DIAGNOSIS — F411 Generalized anxiety disorder: Secondary | ICD-10-CM | POA: Diagnosis not present

## 2022-03-05 DIAGNOSIS — F411 Generalized anxiety disorder: Secondary | ICD-10-CM | POA: Diagnosis not present

## 2022-03-12 DIAGNOSIS — Z79899 Other long term (current) drug therapy: Secondary | ICD-10-CM | POA: Diagnosis not present

## 2022-03-12 DIAGNOSIS — M459 Ankylosing spondylitis of unspecified sites in spine: Secondary | ICD-10-CM | POA: Diagnosis not present

## 2022-03-12 DIAGNOSIS — L409 Psoriasis, unspecified: Secondary | ICD-10-CM | POA: Diagnosis not present

## 2022-04-02 DIAGNOSIS — F411 Generalized anxiety disorder: Secondary | ICD-10-CM | POA: Diagnosis not present

## 2022-04-16 DIAGNOSIS — F411 Generalized anxiety disorder: Secondary | ICD-10-CM | POA: Diagnosis not present

## 2022-04-30 DIAGNOSIS — F411 Generalized anxiety disorder: Secondary | ICD-10-CM | POA: Diagnosis not present

## 2022-05-15 DIAGNOSIS — R0981 Nasal congestion: Secondary | ICD-10-CM | POA: Diagnosis not present

## 2022-05-15 DIAGNOSIS — J029 Acute pharyngitis, unspecified: Secondary | ICD-10-CM | POA: Diagnosis not present

## 2022-05-28 DIAGNOSIS — F411 Generalized anxiety disorder: Secondary | ICD-10-CM | POA: Diagnosis not present

## 2022-06-01 DIAGNOSIS — Z0000 Encounter for general adult medical examination without abnormal findings: Secondary | ICD-10-CM | POA: Diagnosis not present

## 2022-06-01 DIAGNOSIS — Z13220 Encounter for screening for lipoid disorders: Secondary | ICD-10-CM | POA: Diagnosis not present

## 2022-06-01 DIAGNOSIS — R891 Abnormal level of hormones in specimens from other organs, systems and tissues: Secondary | ICD-10-CM | POA: Diagnosis not present

## 2022-09-10 DIAGNOSIS — Z79899 Other long term (current) drug therapy: Secondary | ICD-10-CM | POA: Diagnosis not present

## 2022-09-10 DIAGNOSIS — R5383 Other fatigue: Secondary | ICD-10-CM | POA: Diagnosis not present

## 2022-09-10 DIAGNOSIS — M459 Ankylosing spondylitis of unspecified sites in spine: Secondary | ICD-10-CM | POA: Diagnosis not present

## 2022-09-10 DIAGNOSIS — L409 Psoriasis, unspecified: Secondary | ICD-10-CM | POA: Diagnosis not present

## 2022-10-06 DIAGNOSIS — F411 Generalized anxiety disorder: Secondary | ICD-10-CM | POA: Diagnosis not present

## 2022-10-20 DIAGNOSIS — Z124 Encounter for screening for malignant neoplasm of cervix: Secondary | ICD-10-CM | POA: Diagnosis not present

## 2022-10-20 DIAGNOSIS — Z1151 Encounter for screening for human papillomavirus (HPV): Secondary | ICD-10-CM | POA: Diagnosis not present

## 2022-10-20 DIAGNOSIS — Z01419 Encounter for gynecological examination (general) (routine) without abnormal findings: Secondary | ICD-10-CM | POA: Diagnosis not present

## 2023-03-16 DIAGNOSIS — Z79899 Other long term (current) drug therapy: Secondary | ICD-10-CM | POA: Diagnosis not present

## 2023-03-16 DIAGNOSIS — L409 Psoriasis, unspecified: Secondary | ICD-10-CM | POA: Diagnosis not present

## 2023-03-16 DIAGNOSIS — M459 Ankylosing spondylitis of unspecified sites in spine: Secondary | ICD-10-CM | POA: Diagnosis not present

## 2023-09-22 DIAGNOSIS — R5383 Other fatigue: Secondary | ICD-10-CM | POA: Diagnosis not present

## 2023-09-22 DIAGNOSIS — M459 Ankylosing spondylitis of unspecified sites in spine: Secondary | ICD-10-CM | POA: Diagnosis not present

## 2023-09-22 DIAGNOSIS — Z79899 Other long term (current) drug therapy: Secondary | ICD-10-CM | POA: Diagnosis not present

## 2023-09-22 DIAGNOSIS — L409 Psoriasis, unspecified: Secondary | ICD-10-CM | POA: Diagnosis not present

## 2023-10-21 DIAGNOSIS — Z01419 Encounter for gynecological examination (general) (routine) without abnormal findings: Secondary | ICD-10-CM | POA: Diagnosis not present

## 2023-10-21 DIAGNOSIS — Z130 Encounter for screening for diseases of the blood and blood-forming organs and certain disorders involving the immune mechanism: Secondary | ICD-10-CM | POA: Diagnosis not present

## 2023-10-21 DIAGNOSIS — R102 Pelvic and perineal pain: Secondary | ICD-10-CM | POA: Diagnosis not present

## 2023-10-21 DIAGNOSIS — R3129 Other microscopic hematuria: Secondary | ICD-10-CM | POA: Diagnosis not present

## 2024-01-18 DIAGNOSIS — Z3201 Encounter for pregnancy test, result positive: Secondary | ICD-10-CM | POA: Diagnosis not present
